# Patient Record
Sex: Male | Born: 1985 | Race: White | Hispanic: No | Marital: Single | Smoking: Never smoker
Health system: Southern US, Community
[De-identification: ages and names within clinical notes are randomized; demographics above are authoritative.]

## PROBLEM LIST (undated history)

## (undated) DIAGNOSIS — F1010 Alcohol abuse, uncomplicated: Secondary | ICD-10-CM

## (undated) DIAGNOSIS — F419 Anxiety disorder, unspecified: Secondary | ICD-10-CM

## (undated) HISTORY — PX: HAND SURGERY: SHX662

---

## 2006-04-23 ENCOUNTER — Emergency Department (HOSPITAL_COMMUNITY): Admission: EM | Admit: 2006-04-23 | Discharge: 2006-04-24 | Payer: Self-pay | Admitting: Emergency Medicine

## 2006-05-14 ENCOUNTER — Emergency Department (HOSPITAL_COMMUNITY): Admission: EM | Admit: 2006-05-14 | Discharge: 2006-05-14 | Payer: Self-pay | Admitting: Emergency Medicine

## 2007-12-21 ENCOUNTER — Ambulatory Visit (HOSPITAL_COMMUNITY): Admission: EM | Admit: 2007-12-21 | Discharge: 2007-12-21 | Payer: Self-pay | Admitting: Emergency Medicine

## 2010-09-21 NOTE — Op Note (Signed)
NAMEMANFORD, SPRONG              ACCOUNT NO.:  [id number]   MEDICAL RECORD NO.:  [id number]          PATIENT TYPE:  INP   LOCATION:  2550                         FACILITY:  [facility]   PHYSICIAN:  Tennis Must Meyerdierks, M.D.DATE OF BIRTH:  06/29/1985   DATE OF PROCEDURE:  12/21/2007  DATE OF DISCHARGE:  12/21/2007                               OPERATIVE REPORT   PREOPERATIVE DIAGNOSIS:  Partial median nerve laceration, left palm.   POSTOPERATIVE DIAGNOSES:  Partial median nerve laceration, left palm  with simple laceration, left third web space.   PROCEDURE:  Repair of partial median nerve laceration, left palm with  repair of laceration, left third web space.   SURGEON:  Lowell Bouton, MD   ANESTHESIA:  General.   OPERATIVE FINDINGS:  The patient had an avulsive-type partial median  nerve laceration.  The ulnar 10% of the nerve had 3 fascicles that were  avulsed from under the epineurial sheath.  The radial portion of the  nerve was intact, and the laceration was proximal to the motor branch  takeoff.  The carpal tunnel and transverse carpal ligament had been  released with the injury.  There were no flexor tendon injuries.   PROCEDURE:  Under general anesthesia with a tourniquet on the left arm,  the left hand was prepped and draped in the usual fashion.  After  elevating the limb, the tourniquet was inflated to 250 mmHg.  The  laceration was about 8 cm in length and it was slightly jagged.  It was  contaminated, and the wound was irrigated copiously and debris was  picked out of the wound edges.  After completely cleansing the wound,  the median nerve was examined under loupe magnification and was found to  have about 3 fascicles avulsed ulnarly.  The remainder of the nerve was  intact.  The injury was in the proximal end of the wound, so the  laceration was extended proximally across the wrist crease.  Blunt  dissection was carried down through the subcutaneous  tissues in the  forearm.  Distal forearm fascia was released with the scissors.  The  microscope was then brought into the field after draping it and was  focused on the area of injury.  The epineurium was released with a  scissors in an attempt to find the distal end of the avulsed fragment of  fascicles.  One fascicle was repaired with a 9-0 nylon suture that had  been transected but was not avulsed.  The avulsed fragments, there were  2 fascicles that were then brought underneath the epineurium and  repaired to the remaining nerve.  This was performed after releasing the  epineurium.  Interfascicular repair was performed with a 9-0 nylon  suture.  The wound was then irrigated copiously with saline.  A vessel  loop drain was left in for drainage.  Marcaine 0.5% was placed in the  skin edges for pain control.  The skin was closed with 4-0 nylon  sutures.  At this point, it was noted that there was a 2-cm laceration  longitudinally in the third web space and  this was irrigated and then closed with 4-0 nylon sutures.  Sterile  dressings were applied followed by a volar wrist splint.  The tourniquet  was released with good circulation of the hand.  The patient went to the  recovery room, awake, stable, and in good condition.      Lowell Bouton, M.D.  Electronically Signed     EMM/MEDQ  D:  12/21/2007  T:  12/22/2007  Job:  [id number]

## 2011-02-04 LAB — CBC
MCHC: 34.2
MCV: 88.3
Platelets: 164
RBC: 5.29
RDW: 13.4
WBC: 16 — ABNORMAL HIGH

## 2011-02-04 LAB — POCT I-STAT, CHEM 8
Calcium, Ion: 1.14
Glucose, Bld: 132 — ABNORMAL HIGH
HCT: 49
TCO2: 25

## 2011-02-04 LAB — DIFFERENTIAL
Eosinophils Absolute: 0
Monocytes Relative: 6

## 2011-06-27 ENCOUNTER — Emergency Department: Payer: Self-pay | Admitting: *Deleted

## 2012-04-05 ENCOUNTER — Encounter (HOSPITAL_COMMUNITY): Payer: Self-pay | Admitting: Emergency Medicine

## 2012-04-05 ENCOUNTER — Emergency Department (HOSPITAL_COMMUNITY): Payer: Self-pay

## 2012-04-05 ENCOUNTER — Emergency Department (HOSPITAL_COMMUNITY)
Admission: EM | Admit: 2012-04-05 | Discharge: 2012-04-05 | Disposition: A | Discharge status: Home / Self Care | Payer: Self-pay | Attending: Emergency Medicine | Admitting: Emergency Medicine

## 2012-04-05 DIAGNOSIS — IMO0002 Reserved for concepts with insufficient information to code with codable children: Secondary | ICD-10-CM | POA: Insufficient documentation

## 2012-04-05 DIAGNOSIS — F419 Anxiety disorder, unspecified: Secondary | ICD-10-CM

## 2012-04-05 DIAGNOSIS — F411 Generalized anxiety disorder: Secondary | ICD-10-CM | POA: Insufficient documentation

## 2012-04-05 DIAGNOSIS — R0602 Shortness of breath: Secondary | ICD-10-CM | POA: Insufficient documentation

## 2012-04-05 HISTORY — DX: Anxiety disorder, unspecified: F41.9

## 2012-04-05 MED ORDER — ALPRAZOLAM 0.5 MG PO TABS
0.5000 mg | ORAL_TABLET | Freq: Once | ORAL | Status: AC
  Administered 2012-04-05: 0.5 mg via ORAL
  Filled 2012-04-05: qty 1

## 2012-04-05 MED ORDER — HYDROXYZINE HCL 25 MG PO TABS: 25.0000 mg | ORAL_TABLET | Freq: Four times a day (QID) | ORAL | Status: DC
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
## 2012-04-05 NOTE — ED Notes (Signed)
Patient transported to X-ray 

## 2012-04-05 NOTE — ED Notes (Signed)
Pt states earlier he had a sensation in his chest he describes as a zap and since then he has been having intermittent pains in his chest  States he became a little winded walking into the hospital but denies any other sxs at this time

## 2012-04-05 NOTE — ED Notes (Signed)
Pt denies radiation of pain. States pain is like an ache to L side of ribs. Pt denies nausea, shortness of air. Skin warm dry.

## 2012-04-08 NOTE — ED Provider Notes (Signed)
Medical screening examination/treatment/procedure(s) were performed by non-physician practitioner and as supervising physician I was immediately available for consultation/collaboration.  Deeandra Jerry T Eilish Mcdaniel, MD 04/08/12 2220 

## 2014-04-17 IMAGING — CR DG CHEST 2V
2 series · 2 of 2 positions shown · non-contrast
Comparison: Chest radiograph performed 12/21/2007

CLINICAL DATA: Left-sided chest pain.

CHEST - 2 VIEW

[w chest pa]
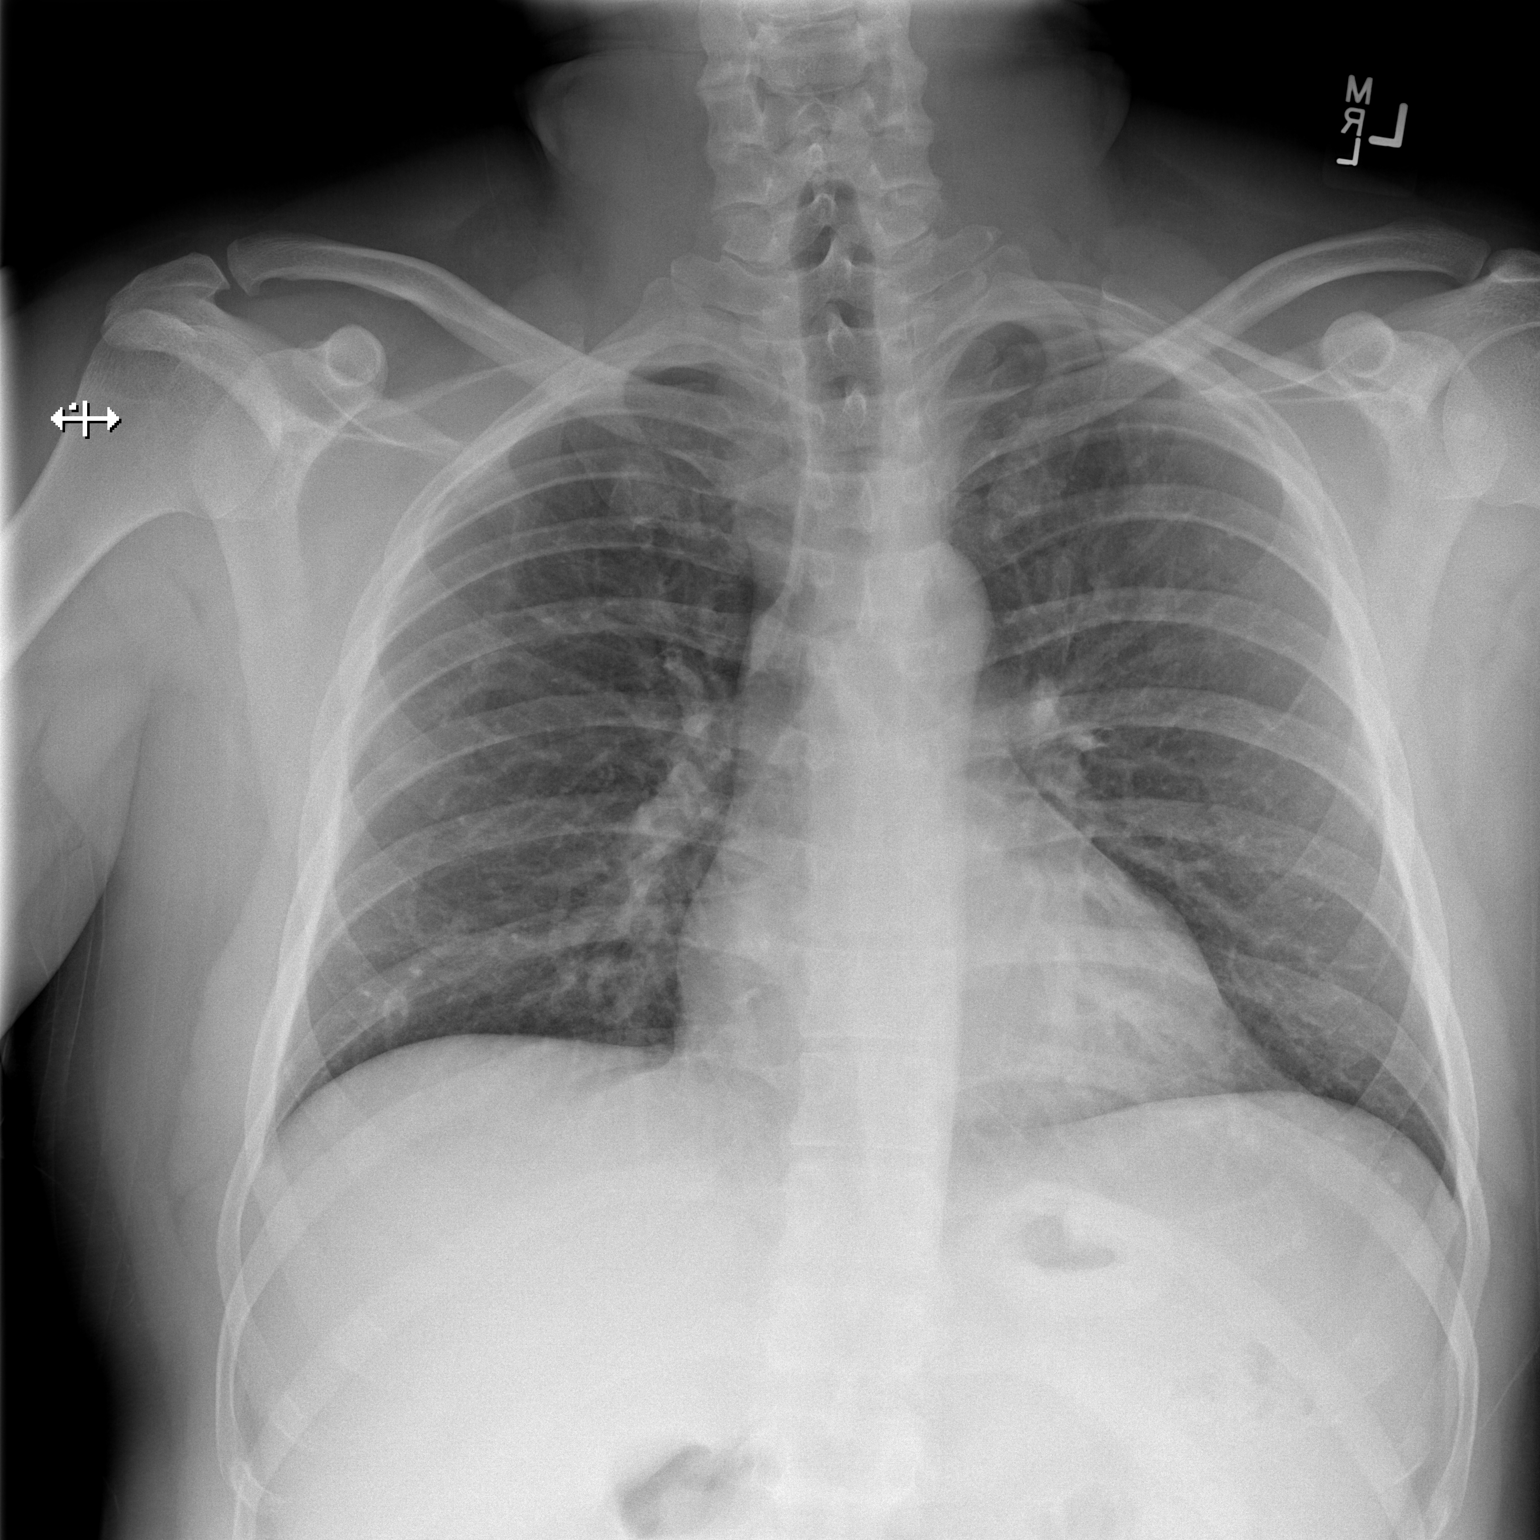

[w chest lat]
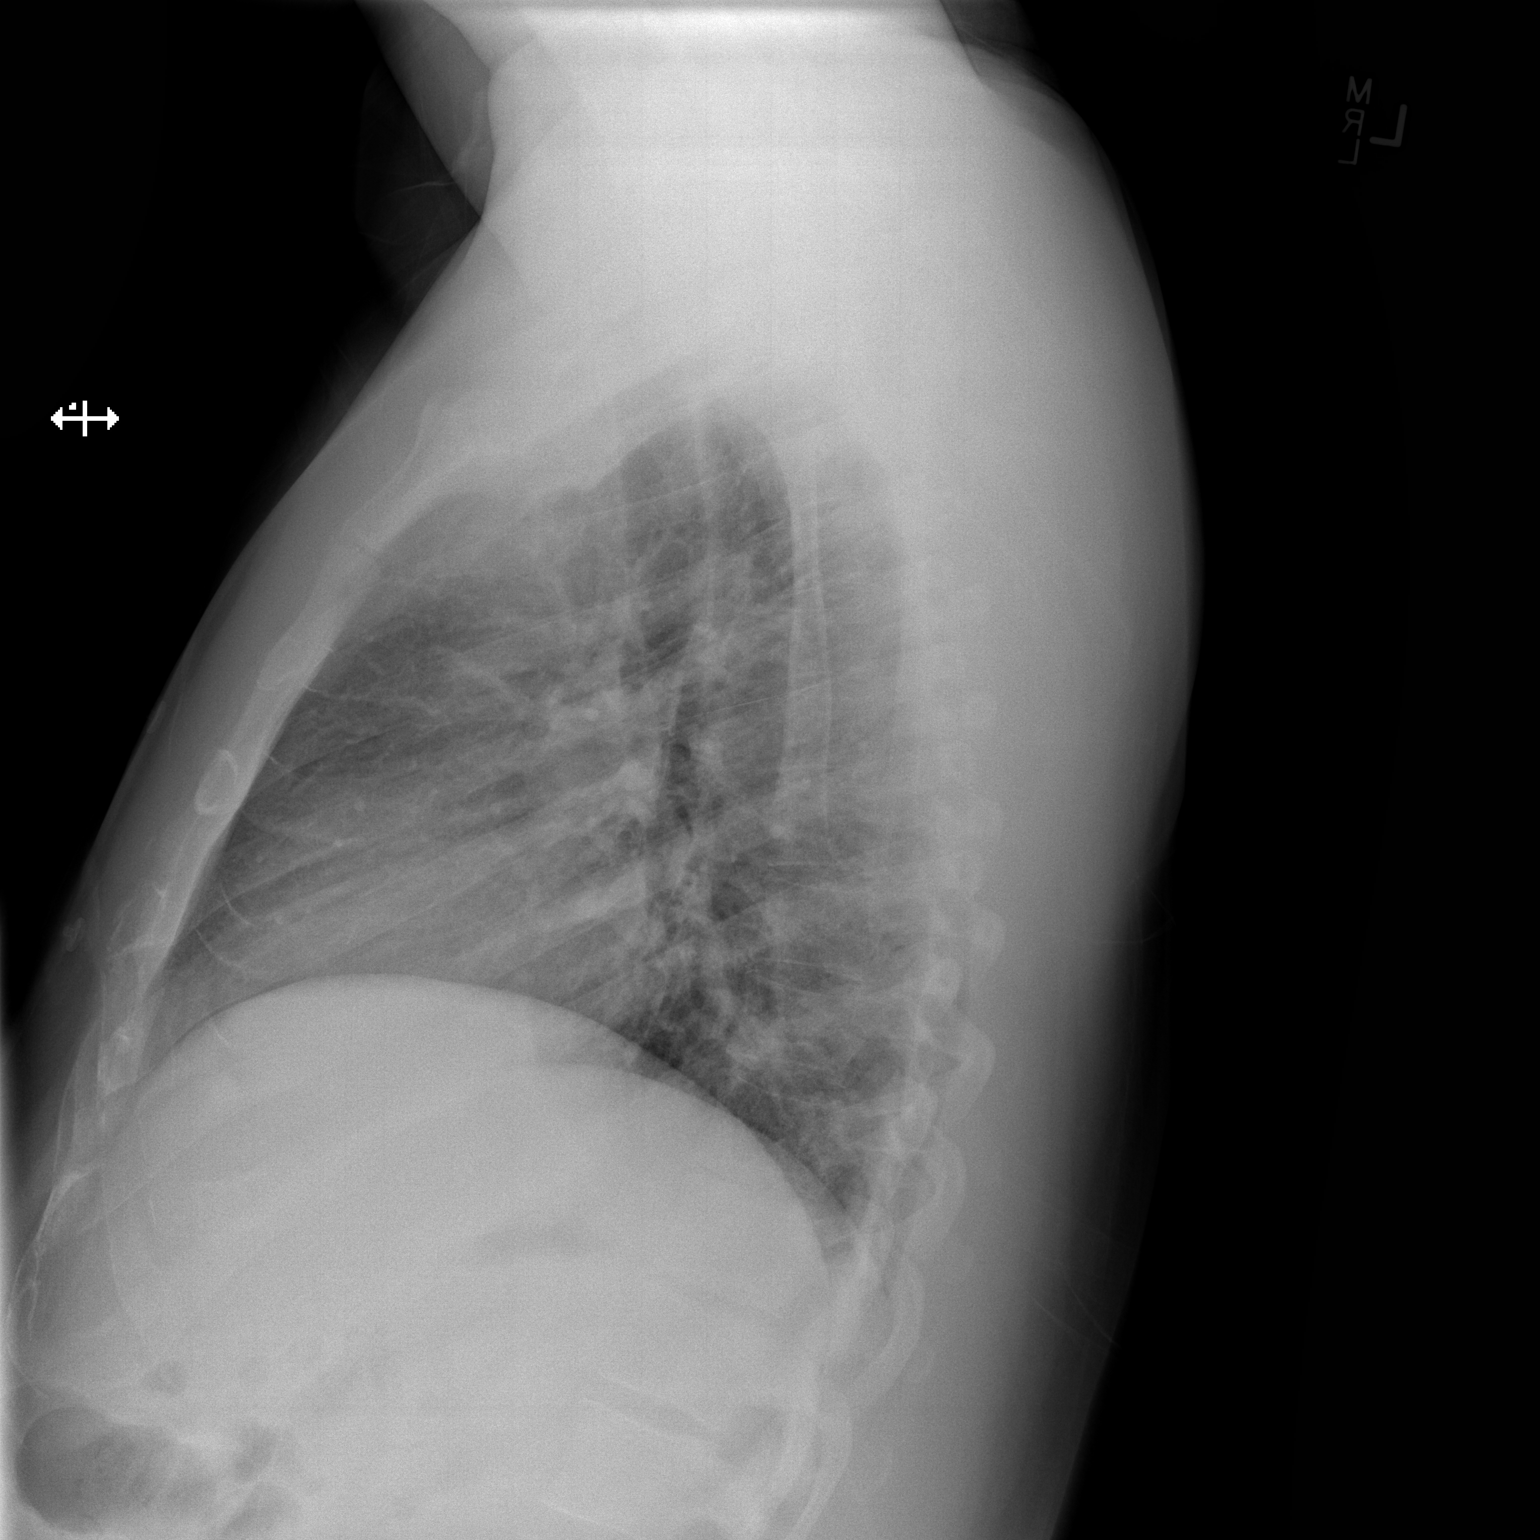

[2 of 2 positions shown; findings below may reference images not displayed]

FINDINGS: The lungs are well-aerated.  Minimal atelectasis or
scarring is noted at the right lung base.  There is no evidence of
focal opacification, pleural effusion or pneumothorax.

The heart is normal in size; the mediastinal contour is within
normal limits.  No acute osseous abnormalities are seen.
IMPRESSION: Minimal atelectasis or scarring noted at the right lung base; lungs
otherwise clear.  No displaced rib fractures seen.

## 2017-10-05 ENCOUNTER — Other Ambulatory Visit: Payer: Self-pay

## 2017-10-05 ENCOUNTER — Ambulatory Visit: Payer: Self-pay | Admitting: Physician Assistant

## 2017-10-05 ENCOUNTER — Encounter: Payer: Self-pay | Admitting: Physician Assistant

## 2017-10-05 VITALS — HR 80 | Resp 16 | Ht 70.0 in | Wt 255.0 lb

## 2017-10-05 DIAGNOSIS — F429 Obsessive-compulsive disorder, unspecified: Secondary | ICD-10-CM

## 2017-10-05 DIAGNOSIS — F411 Generalized anxiety disorder: Secondary | ICD-10-CM

## 2017-10-05 MED ORDER — BUSPIRONE HCL 10 MG PO TABS: 10.0000 mg | ORAL_TABLET | Freq: Three times a day (TID) | ORAL | 0 refills | Status: DC

## 2017-10-05 NOTE — Progress Notes (Signed)
Nathan Chang  MRN: [id number] DOB: Jul 26, 1985  PCP: Nathan Riddle, PA-C  Chief Complaint  Patient presents with  . New pt    health maintenance discussed w/pt.  . medication    per pt therapist would like a medication added to Sertraline 100 mg    Subjective:  Pt presents to clinic for for evaluation and medication discussion regarding his anxiety.  Patient has  had anxiety for as long as he can remember, even into his childhood he was a nervous person.  About 3 years ago he sought treatment for this and was started on Zoloft.  He feels with this treatment he is about 50% better which is a great improvement for him.  He still has significant anxiety which at times is debilitating for him.  He  is able to hide this from people around him and most people think he is fine.  He has been given Ativan for rescue but he finds that this makes him groggy and he does not like the way he feels so he does not use it.  Instead he drinks beer as a rescue medicine sometimes starting around lunchtime working about 8-10 beers a day.  He he never gets drunk.  He does sometimes drink a beer after work just for [employer] and enjoyment drinking beer and not for his anxiety.  Patient feels like he has done something to deserve the symptoms that he is having of his anxiety.  He feels that he is failed because he has the symptoms.  No drugs No smoke    Seeing Victorino Dike [facility] for Cognitive therapy weekly for therapy.  He really does feel like this has helped.  He has made positive changes in his life over the last several months resulting from this therapy.  He started therapy approximately 4 months ago.  History is obtained by patient and wife who accompanies patient today.  Also discussed patient with Daphene Calamity at [facility] for cognitive therapy.  Patient has significant generalized anxiety with some OCD tendencies.  He has had periods of time in his life in the past where his blood pressure was  elevated likely due to whitecoat hypertension/anxiety.  After that patient became obsessed with blood pressure and was checking it many many times a day and therefore is concerned to have medical evaluation of blood pressure.  Review of Systems  Psychiatric/Behavioral: Negative for dysphoric mood, sleep disturbance and suicidal ideas. The patient is nervous/anxious.     There are no active problems to display for this patient.   Current Outpatient Medications on File Prior to Visit  Medication Sig Dispense Refill  . sertraline (ZOLOFT) 100 MG tablet Take 100 mg by mouth daily.     No current facility-administered medications on file prior to visit.     Allergies  Allergen Reactions  . Penicillins     Past Medical History:  Diagnosis Date  . Anxiety    Social History   Social History Narrative   Lives with wife and son   [employer]   Social History   Tobacco Use  . Smoking status: Never Smoker  . Smokeless tobacco: Never Used  Substance Use Topics  . Alcohol use: Yes    Comment: 8-10 beers a day  . Drug use: No   family history includes Anxiety disorder in his mother; Heart attack in his father.     Objective:  Pulse 80 Comment: pt refused  Resp 16   Ht   (1.778 m)   Wt 255 lb (115.7 kg)   BMI 36.59 kg/m  Body mass index is 36.59 kg/m.  Physical Exam  Constitutional: He is oriented to person, place, and time. He appears well-developed and well-nourished.  HENT:  Head: Normocephalic and atraumatic.  Right Ear: External ear normal.  Left Ear: External ear normal.  Eyes: Conjunctivae are normal.  Neck: Normal range of motion.  Pulmonary/Chest: Effort normal.  Neurological: He is alert and oriented to person, place, and time.  Skin: Skin is warm and dry.  Psychiatric: He has a normal mood and affect. His behavior is normal. Judgment and thought content normal.  Tearful at times  Vitals reviewed.   Assessment and Plan :  GAD  (generalized anxiety disorder) - Plan: busPIRone (BUSPAR) 10 MG tablet  Obsessive-compulsive disorder, unspecified type   Patient continues to have significant anxiety even with his 50% improvement and high doses of  Zoloft.  To help the remaining symptoms we will start BuSpar treatment for today.  He will start off with 5 mg twice a day and we will titrate up as he tolerates.  We did discuss in the future perhaps changing to Pristiq.  As well as adding Klonopin for rescue medication versus  beer as the Ativan does not work well for him.  For now I really only want to do one change to his medication regimen.  He is very hesitant to be on medications though does seem excited about perhaps controlling his anxiety better.  He will continue therapy with Victorino Dike at [facility] for cognitive therapy weekly.  He will sign up for my chart to communicate with me regarding his medication and its results.  He will see me in a month.  Patient's blood pressure was not checked today due to having obsessive thoughts regarding elevated blood pressure.  The hope is that we will control his anxiety better so he will feel comfortable with having his blood pressure checked.    Benny Lennert PA-C  Primary [facility] at [facility] 10/05/2017 7:46 PM

## 2017-10-05 NOTE — Patient Instructions (Addendum)
Continue on the Zoloft - take every day  Start Buspar - 1/2 pill 2x/day - in 1 week you will let me Victorino Dike know the results - we will likely increase your dose - you will likely be on it for 3x/day  Please download the APP called mychart - then use the text to activate this APP - this will allow you to look at your labs and contact me as well as make appointments to see me in the future.   IF you received an x-ray today, you will receive an invoice from [facility]. Please contact [facility] at [phone] with questions or concerns regarding your invoice.   IF you received labwork today, you will receive an invoice from [facility]. Please contact [facility] at [phone] with questions or concerns regarding your invoice.   Our billing staff will not be able to assist you with questions regarding bills from these companies.  You will be contacted with the lab results as soon as they are available. The fastest way to get your results is to activate your My Chart account. Instructions are located on the last page of this paperwork. If you have not heard from [place] regarding the results in 2 weeks, please contact this office.

## 2017-10-30 ENCOUNTER — Other Ambulatory Visit: Payer: Self-pay | Admitting: Physician Assistant

## 2017-10-30 ENCOUNTER — Encounter: Payer: Self-pay | Admitting: Physician Assistant

## 2017-10-30 DIAGNOSIS — F411 Generalized anxiety disorder: Secondary | ICD-10-CM

## 2017-10-30 MED ORDER — BUSPIRONE HCL 10 MG PO TABS: 10.0000 mg | ORAL_TABLET | Freq: Three times a day (TID) | ORAL | 0 refills | Status: DC

## 2017-11-27 ENCOUNTER — Encounter: Payer: Self-pay | Admitting: Physician Assistant

## 2017-11-27 ENCOUNTER — Other Ambulatory Visit: Payer: Self-pay | Admitting: Physician Assistant

## 2017-11-27 DIAGNOSIS — F411 Generalized anxiety disorder: Secondary | ICD-10-CM

## 2017-11-30 MED ORDER — BUSPIRONE HCL 10 MG PO TABS: 10.0000 mg | ORAL_TABLET | Freq: Three times a day (TID) | ORAL | 0 refills | Status: DC

## 2017-12-29 ENCOUNTER — Other Ambulatory Visit: Payer: Self-pay | Admitting: Physician Assistant

## 2017-12-29 DIAGNOSIS — F411 Generalized anxiety disorder: Secondary | ICD-10-CM

## 2017-12-29 MED ORDER — BUSPIRONE HCL 10 MG PO TABS: 10.0000 mg | ORAL_TABLET | Freq: Three times a day (TID) | ORAL | 0 refills | Status: DC

## 2017-12-31 ENCOUNTER — Encounter: Payer: Self-pay | Admitting: Physician Assistant

## 2018-01-04 MED ORDER — SERTRALINE HCL 100 MG PO TABS: 200.0000 mg | ORAL_TABLET | Freq: Every day | ORAL | 0 refills | Status: DC

## 2018-02-14 ENCOUNTER — Other Ambulatory Visit: Payer: Self-pay | Admitting: Physician Assistant

## 2023-05-18 ENCOUNTER — Ambulatory Visit (HOSPITAL_COMMUNITY)
Admission: EM | Admit: 2023-05-18 | Discharge: 2023-05-18 | Disposition: A | Discharge status: Home / Self Care | Payer: No Payment, Other | Attending: Psychiatry | Admitting: Psychiatry

## 2023-05-18 DIAGNOSIS — F411 Generalized anxiety disorder: Secondary | ICD-10-CM | POA: Insufficient documentation

## 2023-05-18 DIAGNOSIS — F339 Major depressive disorder, recurrent, unspecified: Secondary | ICD-10-CM | POA: Insufficient documentation

## 2023-05-18 DIAGNOSIS — Z560 Unemployment, unspecified: Secondary | ICD-10-CM | POA: Insufficient documentation

## 2023-05-18 DIAGNOSIS — F410 Panic disorder [episodic paroxysmal anxiety] without agoraphobia: Secondary | ICD-10-CM | POA: Insufficient documentation

## 2023-05-18 DIAGNOSIS — Z79899 Other long term (current) drug therapy: Secondary | ICD-10-CM | POA: Insufficient documentation

## 2023-05-18 MED ORDER — HYDROXYZINE HCL 25 MG PO TABS: 25.0000 mg | ORAL_TABLET | Freq: Three times a day (TID) | ORAL | 0 refills | Status: DC | PRN

## 2023-05-18 MED ORDER — HYDROXYZINE HCL 25 MG PO TABS: 25.0000 mg | ORAL_TABLET | Freq: Three times a day (TID) | ORAL | Status: DC | PRN

## 2023-05-18 MED ORDER — HYDROXYZINE HCL 25 MG PO TABS
50.0000 mg | ORAL_TABLET | ORAL | Status: AC
  Administered 2023-05-18: 50 mg via ORAL
  Filled 2023-05-18: qty 2

## 2023-05-18 NOTE — Progress Notes (Signed)
   05/18/23 1923  [facility] Triage Screening (Walk-ins at [facility] only)  How Did You Hear About Us ? Self  What Is the Reason for Your Visit/Call Today? Pt is a 38 yo male who presents to [facility]  accompanied by his wife.Pt is seeking treatment for his alcohol use and panic attacks.  Pt reports that for the past week he has been heavily drinking beer and has panic attacks that is causing him not sleep or eat. Pt reports that he is drinking 12-15 beers daily. Pt reports the last use today. Pt reports hx of Panic Disorder. Pt reports that he stopped drinking in Oct 2024 for 2 months but began heavily drinking this past week.  Pt denies having outpatient services. Pt is prescribed Lexapro from his primary doctor. Pt reports havinh passive SI thought but denies plan and intent. Pt denies HI, and AVH.  How Long Has This Been Causing You Problems? 1 wk - 1 month  Have You Recently Had Any Thoughts About Hurting Yourself? Yes  How long ago did you have thoughts about hurting yourself? This past week  Are You Planning to Commit Suicide/Harm Yourself At This time? No  Have you Recently Had Thoughts About Hurting Someone Sherral? No  Are You Planning To Harm Someone At This Time? No  Physical Abuse Denies  Verbal Abuse Denies  Sexual Abuse Denies  Exploitation of patient/patient's resources Denies  Self-Neglect Denies  Possible abuse reported to:  (n/a)  Are you currently experiencing any auditory, visual or other hallucinations? No  Have You Used Any Alcohol or Drugs in the Past 24 Hours? Yes  How long ago did you use Drugs or Alcohol? Earlier today  What Did You Use and How Much? Alcohol ( Beers) 12-15 beers  Do you have any current medical co-morbidities that require immediate attention? No  Clinician description of patient physical appearance/behavior: Pt was anxious and tearful but cooperative.  What Do You Feel Would Help You the Most Today? Alcohol or Drug Use Treatment;Treatment for Depression or other mood  problem;Medication(s);Stress Management  If access to [facility] was not available, would you have sought care in the Emergency Department? Yes  Determination of Need Urgent (48 hours)  Options For Referral Facility-Based Crisis  Determination of Need filed? Yes    Flowsheet Row ED from 05/18/2023 in [facility]  C-SSRS RISK CATEGORY Low Risk

## 2023-05-18 NOTE — ED Provider Notes (Signed)
 [facility] Medical Screening Exam  Patient Name: Nathan Chang. MRN: [id number] Date of Evaluation: 05/18/23 Chief Complaint:  been having increase anxiety and panic attach  Diagnosis:  Final diagnoses:  Generalized anxiety disorder  Panic attack  Recurrent major depressive disorder, remission status unspecified (HCC)    History of Present illness: Nathan Chang. is a 38 y.o. male. With a history of MDD and anxiety presented to [facility] accompanied by his wife.  Per the patient he has been having panic attacks where he would wake up at night feeling cold heart racing feel like a flight or fight mode, not wanting to eat or sleep.  Per the patient this has happened to him before but it only lasted that 2 days but this last episode has been since last week.  Patient also reports feeling fearful of being alone.  According to patient he is currently not seeing a psychiatrist or therapist but is prescribed Lexapro 10 mg by his PCP.  Patient reported that he has taken lorazepam  at in the past which helps a lot.  Patient is currently unemployed lives with his wife and his 55-year-old son.  Patient denies any prior hospitalization for any psychiatric problems.  Face-to-face evaluation of patient, patient is alert and oriented x 4, speech is clear, maintain eye contact.  Patient is observed sitting beside his wife overall patient appearance is pleasant and well groomed.  Patient denies HI, SI, AVH or paranoia.  Patient denies wanting to hurt himself or others.  Patient reports that he drinks alcohol on a daily basis, reports that alcohol giving him relief from anxiety.  When asked if he is looking detox patient stated no he just needed to talk to someone and get some relief for his anxiety.  According to the patient he spoke with his PCP today but they did not have any available appointments so he is just waiting to hear back from them to go in for an appointment.  Patient reports  he has a gun at home but does not have suicidal ideation or does not ever want to hurt anyone. Patient does not seem to be influenced by external/internal stimuli.  Patient denies smoking and denies any illicit drug use at this time. Writer discussed with patient that we could give him some hydroxyzine  25 to 50 mg 3 times a day prescription until he can get to going to see his PCP given the inclement weather patient may not be able to get in to see his PCP until next Monday.   Patient is advised to call 911 or go to the nearest ED should he experience any suicidal ideation/homicidal thoughts/hallucination.  Patient verbalized understanding. At the end of this evaluation patient does not seem to pose a risk to himself or others is not suicidal or homicidal not hearing voices is not influenced by external or internal stimuli and can safely be discharged home to follow up with his PCP. Patient was given a prescription for hydroxyzine  as needed can take up to 3 times a day for the next 5 days and need to follow up with PCP for continuation.  Patient is also advised that he can increase his Lexapro to 20 mg until he goes in to see his PCP.  Meds ordered this encounter  Medications   DISCONTD: hydrOXYzine  (ATARAX ) tablet 25 mg   hydrOXYzine  (ATARAX ) tablet 50 mg   hydrOXYzine  (ATARAX ) 25 MG tablet    Sig: Take 1 tablet (25 mg total)  by mouth 3 (three) times daily as needed for anxiety.    Dispense:  15 tablet    Refill:  0    Supervising Provider:   ZOUEV, DMITRI [[id number]]     Recommend discharge for patient to f/u with PCP   Flowsheet Row ED from 05/18/2023 in [facility]  C-SSRS RISK CATEGORY Low Risk       Psychiatric Specialty Exam  Presentation  General Appearance:Casual  Eye Contact:Good  Speech:Clear and Coherent  Speech Volume:Normal  Handedness:Right   Mood and Affect  Mood: Anxious; Depressed  Affect: Appropriate   Thought Process   Thought Processes: Coherent  Descriptions of Associations:Circumstantial  Orientation:Full (Time, Place and Person)  Thought Content:Logical    Hallucinations:None  Ideas of Reference:None  Suicidal Thoughts:No  Homicidal Thoughts:No   Sensorium  Memory: Immediate Good  Judgment: Good  Insight: Good   Executive Functions  Concentration: Good  Attention Span: Good  Recall: Good  Fund of Knowledge: Good  Language: Good   Psychomotor Activity  Psychomotor Activity: Normal   Assets  Assets: Communication Skills   Sleep  Sleep: Poor  Number of hours:  3   Physical Exam: Physical Exam HENT:     Head: Normocephalic.     Nose: Nose normal.  Eyes:     Pupils: Pupils are equal, round, and reactive to light.  Cardiovascular:     Rate and Rhythm: Normal rate.  Pulmonary:     Effort: Pulmonary effort is normal.  Musculoskeletal:        General: Normal range of motion.     Cervical back: Normal range of motion.  Neurological:     General: No focal deficit present.     Mental Status: He is alert.  Psychiatric:        Mood and Affect: Mood normal.    Review of Systems  Constitutional: Negative.   HENT: Negative.    Eyes: Negative.   Respiratory: Negative.    Cardiovascular: Negative.   Gastrointestinal: Negative.   Genitourinary: Negative.   Musculoskeletal: Negative.   Skin: Negative.   Neurological: Negative.   Psychiatric/Behavioral:  Positive for depression. The patient is nervous/anxious and has insomnia.    Blood pressure (!) 157/103, pulse 100, temperature 98.4 F (36.9 C), temperature source Oral, resp. rate 18, SpO2 99%. There is no height or weight on file to calculate BMI.  Musculoskeletal: Strength & Muscle Tone: within normal limits Gait & Station: normal Patient leans: N/A   [facility] MSE Discharge Disposition for Follow up and Recommendations: Based on my evaluation the patient does not appear to have an emergency  medical condition and can be discharged with resources and follow up care in outpatient services for Medication Management and Individual Therapy   Gaither Pouch, NP 05/18/2023, 8:24 PM

## 2023-05-18 NOTE — Discharge Instructions (Signed)
 F/u with PCP.

## 2023-10-21 ENCOUNTER — Other Ambulatory Visit: Payer: Self-pay

## 2023-10-21 ENCOUNTER — Emergency Department (HOSPITAL_COMMUNITY): Payer: Self-pay

## 2023-10-21 ENCOUNTER — Encounter (HOSPITAL_COMMUNITY): Payer: Self-pay

## 2023-10-21 ENCOUNTER — Emergency Department (HOSPITAL_COMMUNITY)
Admission: EM | Admit: 2023-10-21 | Discharge: 2023-10-23 | Disposition: A | Discharge status: Other Acute Inpatient Hospital | Attending: Emergency Medicine | Admitting: Emergency Medicine

## 2023-10-21 ENCOUNTER — Ambulatory Visit (HOSPITAL_COMMUNITY)
Admission: EM | Admit: 2023-10-21 | Discharge: 2023-10-21 | Disposition: A | Discharge status: Other Acute Inpatient Hospital | Attending: Nurse Practitioner | Admitting: Nurse Practitioner

## 2023-10-21 DIAGNOSIS — F1010 Alcohol abuse, uncomplicated: Secondary | ICD-10-CM

## 2023-10-21 DIAGNOSIS — R059 Cough, unspecified: Secondary | ICD-10-CM | POA: Insufficient documentation

## 2023-10-21 DIAGNOSIS — R7401 Elevation of levels of liver transaminase levels: Secondary | ICD-10-CM | POA: Insufficient documentation

## 2023-10-21 DIAGNOSIS — F1090 Alcohol use, unspecified, uncomplicated: Secondary | ICD-10-CM

## 2023-10-21 DIAGNOSIS — R55 Syncope and collapse: Secondary | ICD-10-CM | POA: Insufficient documentation

## 2023-10-21 DIAGNOSIS — R079 Chest pain, unspecified: Secondary | ICD-10-CM | POA: Insufficient documentation

## 2023-10-21 DIAGNOSIS — F32A Depression, unspecified: Secondary | ICD-10-CM | POA: Insufficient documentation

## 2023-10-21 DIAGNOSIS — F10939 Alcohol use, unspecified with withdrawal, unspecified: Secondary | ICD-10-CM | POA: Diagnosis present

## 2023-10-21 DIAGNOSIS — R0602 Shortness of breath: Secondary | ICD-10-CM | POA: Insufficient documentation

## 2023-10-21 DIAGNOSIS — F411 Generalized anxiety disorder: Secondary | ICD-10-CM | POA: Diagnosis present

## 2023-10-21 DIAGNOSIS — R61 Generalized hyperhidrosis: Secondary | ICD-10-CM | POA: Insufficient documentation

## 2023-10-21 DIAGNOSIS — I10 Essential (primary) hypertension: Secondary | ICD-10-CM | POA: Insufficient documentation

## 2023-10-21 DIAGNOSIS — F10239 Alcohol dependence with withdrawal, unspecified: Secondary | ICD-10-CM | POA: Insufficient documentation

## 2023-10-21 DIAGNOSIS — Y907 Blood alcohol level of 200-239 mg/100 ml: Secondary | ICD-10-CM | POA: Insufficient documentation

## 2023-10-21 HISTORY — DX: Alcohol abuse, uncomplicated: F10.10

## 2023-10-21 LAB — COMPREHENSIVE METABOLIC PANEL WITH GFR
ALT: 332 U/L — ABNORMAL HIGH (ref 0–44)
AST: 361 U/L — ABNORMAL HIGH (ref 15–41)
Albumin: 2.7 g/dL — ABNORMAL LOW (ref 3.5–5.0)
Alkaline Phosphatase: 69 U/L (ref 38–126)
Anion gap: 14 (ref 5–15)
BUN: 15 mg/dL (ref 6–20)
CO2: 22 mmol/L (ref 22–32)
Calcium: 7.6 mg/dL — ABNORMAL LOW (ref 8.9–10.3)
Chloride: 98 mmol/L (ref 98–111)
Creatinine, Ser: 1.24 mg/dL (ref 0.61–1.24)
GFR, Estimated: >60 mL/min (ref >60)
Glucose, Bld: 148 mg/dL — ABNORMAL HIGH (ref 70–99)
Potassium: 3.9 mmol/L (ref 3.5–5.1)
Sodium: 134 mmol/L — ABNORMAL LOW (ref 135–145)
Total Bilirubin: 1.2 mg/dL (ref 0.0–1.2)
Total Protein: 5.6 g/dL — ABNORMAL LOW (ref 6.5–8.1)

## 2023-10-21 LAB — PROTIME-INR
INR: 1.2 (ref 0.8–1.2)
Prothrombin Time: 15 s (ref 11.4–15.2)

## 2023-10-21 LAB — ACETAMINOPHEN LEVEL: Acetaminophen (Tylenol), Serum: <10 ug/mL — ABNORMAL LOW (ref 10–30)

## 2023-10-21 LAB — CBC WITH DIFFERENTIAL/PLATELET
Abs Immature Granulocytes: 0.02 10*3/uL (ref 0.00–0.07)
Basophils Absolute: 0.1 10*3/uL (ref 0.0–0.1)
Basophils Relative: 2 %
Eosinophils Absolute: 0 10*3/uL (ref 0.0–0.5)
Eosinophils Relative: 1 %
HCT: 55 % — ABNORMAL HIGH (ref 39.0–52.0)
Hemoglobin: 19.7 g/dL — ABNORMAL HIGH (ref 13.0–17.0)
Immature Granulocytes: 0 %
Lymphocytes Relative: 37 %
Lymphs Abs: 2 10*3/uL (ref 0.7–4.0)
MCH: 30.9 pg (ref 26.0–34.0)
MCHC: 35.8 g/dL (ref 30.0–36.0)
MCV: 86.2 fL (ref 80.0–100.0)
Monocytes Absolute: 0.5 10*3/uL (ref 0.1–1.0)
Monocytes Relative: 9 %
Neutro Abs: 2.9 10*3/uL (ref 1.7–7.7)
Neutrophils Relative %: 51 %
Platelets: 248 10*3/uL (ref 150–400)
RBC: 6.38 MIL/uL — ABNORMAL HIGH (ref 4.22–5.81)
RDW: 12.5 % (ref 11.5–15.5)
WBC: 5.5 10*3/uL (ref 4.0–10.5)
nRBC: 0 % (ref 0.0–0.2)

## 2023-10-21 LAB — TYPE AND SCREEN
ABO/RH(D): A POS
Antibody Screen: NEGATIVE

## 2023-10-21 LAB — SALICYLATE LEVEL: Salicylate Lvl: <7 mg/dL — ABNORMAL LOW (ref 7.0–30.0)

## 2023-10-21 LAB — LIPASE, BLOOD: Lipase: 25 U/L (ref 11–51)

## 2023-10-21 LAB — ETHANOL: Alcohol, Ethyl (B): 235 mg/dL — ABNORMAL HIGH (ref <15)

## 2023-10-21 MED ORDER — LACTATED RINGERS IV BOLUS: 1000.0000 mL | Freq: Once | INTRAVENOUS | Status: AC

## 2023-10-21 MED ORDER — LORAZEPAM 2 MG/ML IJ SOLN
1.0000 mg | Freq: Once | INTRAMUSCULAR | Status: AC
  Filled 2023-10-21: qty 1

## 2023-10-21 MED ORDER — LORAZEPAM 2 MG/ML IJ SOLN
0.0000 mg | Freq: Four times a day (QID) | INTRAMUSCULAR | Status: AC
  Administered 2023-10-22: 1 mg via INTRAVENOUS
  Administered 2023-10-22: 2 mg via INTRAVENOUS
  Administered 2023-10-22: 1 mg via INTRAVENOUS
  Filled 2023-10-21 (×5): qty 1

## 2023-10-21 MED ORDER — ONDANSETRON HCL 4 MG/2ML IJ SOLN
4.0000 mg | Freq: Once | INTRAMUSCULAR | Status: AC
  Administered 2023-10-21: 4 mg via INTRAVENOUS
  Filled 2023-10-21: qty 2

## 2023-10-21 MED ORDER — LORAZEPAM 1 MG PO TABS
0.0000 mg | ORAL_TABLET | Freq: Four times a day (QID) | ORAL | Status: AC
  Administered 2023-10-22 – 2023-10-23 (×3): 1 mg via ORAL
  Filled 2023-10-21 (×3): qty 1

## 2023-10-21 MED ORDER — THIAMINE MONONITRATE 100 MG PO TABS
100.0000 mg | ORAL_TABLET | Freq: Every day | ORAL | Status: DC
  Administered 2023-10-22 – 2023-10-23 (×2): 100 mg via ORAL
  Filled 2023-10-21 (×2): qty 1

## 2023-10-21 MED ORDER — ONDANSETRON HCL 4 MG/2ML IJ SOLN
4.0000 mg | Freq: Once | INTRAMUSCULAR | Status: AC
  Filled 2023-10-21: qty 2

## 2023-10-21 MED ORDER — THIAMINE HCL 100 MG/ML IJ SOLN
100.0000 mg | Freq: Every day | INTRAMUSCULAR | Status: DC
  Filled 2023-10-21: qty 2

## 2023-10-21 NOTE — ED Provider Notes (Signed)
 [facility]gent [facility] Medical Screening Exam  Patient Name: Nathan Chang. MRN: [id number] Date of Evaluation: 10/21/23 Chief Complaint:  worsening depression and alcohol ujse Diagnosis:  Final diagnoses:  Alcohol use disorder   History of Present illness: Nathan Chang. is a 38 y.o. male with a reported history of alcohol use d/o, MDD, GAD, & htn who [facility] accompanied by his wife, brother, and father with complaints of worsening depressive symptoms as well as alcohol use x 2 weeks now.  Assessment: At start of assessment, patient makes attempts to provide some history, but most of the history is obtained from his wife Loris Ros), with patient's verbal consent.  They report that patient has been drinking exclusively alcohol, for the past 4 weeks at least, worsening in the past 2 weeks, and drinking a case of beer +1/5 of liquor daily in that timeframe.  Wife reports that patient has not gotten out of bed in that timeframe as well, both report that patient has presented with some suicidal ideation in that timeframe, however, patient currently denies feeling suicidal, denies homicidal ideations, denies psychosis; specifically denies auditory, visual, or tactile hallucinations.  He denies paranoia, denies delusional thinking, denies first rank symptoms.  There are no overt signs of psychosis.  Patient's wife however reports that patient has required someone to sit with him in that timeframe, has not wanted to sit by himself.  She reports that he has been sleeping for 23 hours/day, has been verbalizing things that do not make sense.  Prior to assessment proceeding any further, rechecked vitals, and blood pressure was 84/66, with a heart rate of 73, and O2 saturation on room air dropped from 99% on room air to 79, and patient briefly lost consciousness, prior to regaining it, and being encouraged to engage in deep breathing exercises.   The nonrebreather mask was applied on patient at 4 L of oxygen of oxygen, and oxygen saturation went back up to 99 to 100%.  Prior to the nonrebreather being applied, patient had some coffee grounds sputum that were concerning for esophageal varices given his prolonged alcohol abuse in the absence of food.  He was also noted to be diaphoretic, sweating profusely, and continued to be in and out of consciousness, requiring for EMS to be called.  Report was called to Dr. Tamela Fake at the [facility] ED for patient to be transported there, for possible medical evaluation and treatment. EMS was able to transport patient to the [facility] ED uneventfully.  Patient's family was notified of the series of events leading to the transfer, and all verbalized understanding. V/S were WNL prior to transfer with a slight elevation in HR at 104.  Flowsheet Row ED from 05/18/2023 in [facility]  C-SSRS RISK CATEGORY Low Risk    Psychiatric Specialty Exam  Presentation  General Appearance:Disheveled  Eye Contact:Fair  Speech:Clear and Coherent  Speech Volume:Decreased  Handedness:Right   Mood and Affect  Mood: Depressed  Affect: Congruent   Thought Process  Thought Processes: Coherent  Descriptions of Associations:Intact  Orientation:Partial  Thought Content:Logical    Hallucinations:None  Ideas of Reference:None  Suicidal Thoughts:No  Homicidal Thoughts:No   Sensorium  Memory: Recent Fair  Judgment: Poor  Insight: Fair   Art therapist  Concentration: Fair  Attention Span: Fair  Recall: [employer] of Knowledge: Fair  Language: Fair   Psychomotor Activity  Psychomotor Activity: Decreased   Assets  Assets: Resilience  Sleep  Sleep: Poor  Number of hours:  3   Physical Exam: Physical Exam Vitals and nursing note reviewed.    Review of Systems  Psychiatric/Behavioral:  Positive for depression and substance  abuse. Negative for hallucinations, memory loss and suicidal ideas. The patient is nervous/anxious and has insomnia.   All other systems reviewed and are negative.  Blood pressure 109/79, pulse (!) 104, temperature 98.2 F (36.8 C), resp. rate 20, SpO2 100%. There is no height or weight on file to calculate BMI.  Musculoskeletal: Strength & Muscle Tone: flaccid Gait & Station: unsteady Patient leans: N/A   [facility] MSE Discharge Disposition for Follow up and Recommendations: Based on my evaluation the patient appears to have an emergency medical condition for which I recommend the patient be transferred to the emergency department for further evaluation.   Robet Chiquito, NP 10/21/2023, 11:49 AM

## 2023-10-21 NOTE — ED Triage Notes (Signed)
 Pt BIB [facility] from [facility] for alcohol withdrawal. Pt last had a drink this morning, is now c/o nausea, anxiety, and generalized weakness. Per EMS BP at [facility] in 80s sys, BP with EMS 100/70, HR 88, all other VSS.

## 2023-10-21 NOTE — ED Provider Notes (Signed)
 [place] EMERGENCY DEPARTMENT AT [facility] Provider Note   CSN: [id number] Arrival date & time: 10/21/23  1200     Patient presents with: Alcohol Problem   Nathan Chang. is a 38 y.o. male.   HPI     38 year old male with a history of alcohol use and dependence, major depressive disorder, generalized anxiety disorder, hypertension who was sent from the behavioral health center with concern for worsening depression, alcohol use and desire for rehab.  Wife and patient report over the last 2 weeks he has been drinking excessively.  He has a history of alcohol abuse in the past, but over the last 2 weeks he has really just been drinking alcohol, and not eating or drinking much of anything else.  He has not been getting out of bed.  He just reports feeling really sick overall.  He also was drinking alcohol this morning.  He has had a history of some withdrawal in the past, but does not have symptoms of withdrawal yet, with drinking this morning.  He has not any fevers.  He a cough, chest pain, shortness of breath.  He has nausea, generalized feeling of being unwell, and feels like he is dehydrated.  He reports he has had some suicidal ideation, but at this moment does not have SI with the plan.  He has had suicidal thoughts over the last couple of weeks and noticed increasing depression.  He reports he is ready for help with his alcohol use.  Prior to Admission medications   Medication Sig Start Date End Date Taking? Authorizing Provider  busPIRone  (BUSPAR ) 10 MG tablet Take 1 tablet (10 mg total) by mouth 3 (three) times daily. 12/29/17   Weber, Clance Crigler, PA-C  hydrOXYzine  (ATARAX ) 25 MG tablet Take 1 tablet (25 mg total) by mouth 3 (three) times daily as needed for anxiety. 05/18/23   Dorthea Gauze, NP  sertraline  (ZOLOFT ) 100 MG tablet Take 2 tablets (200 mg total) by mouth daily. 01/04/18   Emilie Harden, Clance Crigler, PA-C    Allergies: Penicillins    Review of Systems  Updated  Vital Signs BP (!) 124/95 (BP Location: Right Arm)   Pulse 78   Temp 97.8 F (36.6 C) (Oral)   Resp 18   Ht 5' 10 (1.778 m)   Wt 127 kg   SpO2 99%   BMI 40.18 kg/m   Physical Exam  (all labs ordered are listed, but only abnormal results are displayed) Labs Reviewed  CBC WITH DIFFERENTIAL/PLATELET - Abnormal; Notable for the following components:      Result Value   RBC 6.38 (*)    Hemoglobin 19.7 (*)    HCT 55.0 (*)    All other components within normal limits  COMPREHENSIVE METABOLIC PANEL WITH GFR - Abnormal; Notable for the following components:   Sodium 134 (*)    Glucose, Bld 148 (*)    Calcium 7.6 (*)    Total Protein 5.6 (*)    Albumin 2.7 (*)    AST 361 (*)    ALT 332 (*)    All other components within normal limits  ETHANOL - Abnormal; Notable for the following components:   Alcohol, Ethyl (B) 235 (*)    All other components within normal limits  ACETAMINOPHEN LEVEL - Abnormal; Notable for the following components:   Acetaminophen (Tylenol), Serum <10 (*)    All other components within normal limits  SALICYLATE LEVEL - Abnormal; Notable for the following components:  Salicylate Lvl <7.0 (*)    All other components within normal limits  PROTIME-INR  LIPASE, BLOOD  RAPID URINE DRUG SCREEN, HOSP PERFORMED  TYPE AND SCREEN    EKG: EKG Interpretation Date/Time:  Saturday October 21 2023 13:14:25 EDT Ventricular Rate:  71 PR Interval:  166 QRS Duration:  110 QT Interval:  390 QTC Calculation: 424 R Axis:   100  Text Interpretation: Sinus rhythm Confirmed by Lowery Rue [phone]) on 10/21/2023 3:21:38 PM  Radiology: Lenell Query Chest Portable 1 View Result Date: 10/21/2023 CLINICAL DATA:  Hypoxia. EXAM: PORTABLE CHEST 1 VIEW COMPARISON:  04/05/2012. FINDINGS: The heart is mildly enlarged the mediastinal contours within normal limits. Lung volumes are low. No consolidation, effusion, or pneumothorax is seen. No acute osseous abnormality. IMPRESSION: Low lung volumes  with no active disease. Electronically Signed   By: Wyvonnia Heimlich M.D.   On: 10/21/2023 13:15     Procedures   Medications Ordered in the ED  LORazepam (ATIVAN) injection 0-4 mg (1 mg Intravenous Given 10/21/23 2224)    Or  LORazepam (ATIVAN) tablet 0-4 mg ( Oral See Alternative 10/21/23 2224)  thiamine (VITAMIN B1) tablet 100 mg ( Oral See Alternative 10/21/23 1654)    Or  thiamine (VITAMIN B1) injection 100 mg (100 mg Intravenous Given 10/21/23 1654)  lactated ringers bolus 1,000 mL (0 mLs Intravenous Stopped 10/21/23 1512)  ondansetron (ZOFRAN) injection 4 mg (4 mg Intravenous Given 10/21/23 1311)  ondansetron (ZOFRAN) injection 4 mg (4 mg Intravenous Given 10/21/23 1518)  lactated ringers bolus 1,000 mL (0 mLs Intravenous Stopped 10/21/23 1646)  lactated ringers bolus 1,000 mL (0 mLs Intravenous Stopped 10/21/23 1812)  LORazepam (ATIVAN) injection 1 mg (1 mg Intravenous Given 10/21/23 1900)                                      38 year old male with a history of alcohol use and dependence, major depressive disorder, generalized anxiety disorder, hypertension who was sent from the behavioral health center with concern for worsening depression, alcohol use and desire for rehab.  While he was at behavioral health, they reported that his blood pressure was 84/66, oxygen level was down to 79%.  He was placed on nonrebreather.  On arrival to the emergency department, he is having normal oxygen saturation on room air.  His blood pressures had improved to 90s over 60s to 100s.  Labs completed and personally evaluated interpreted by me show mildly elevated hemoglobin likely in the setting of hemoconcentration from dehydration, CMP with hemolysis, however with noted transaminitis.  Alcohol level 235.  Negative acetaminophen and salicylate levels.  Lipase within normal limits.  Not having abdominal pain or tenderness, and have low suspicion that his transaminitis represents cholecystitis, or other acute  intra-abdominal process.  His bilirubin is normal.  Suspect his transaminitis is in the setting of alcohol use.  Chest x-ray obtained shows low lung volumes without active disease.  He still feels nauseas and lightheaded at this time.  Clinically suspect dehydration in the setting of alcohol use.  Not having symptoms of withdrawal yet as he has presented intoxicated but discussed we understand the risk of developing this.   Plan to give him additional fluids, po challenge.  Anticipate medical clearance after fluid.    Plan to consult TTS for further evaluation in setting of depression, alcohol use. He is here voluntarily.       Final diagnoses:  Alcohol abuse  Depression, unspecified depression type    ED Discharge Orders     None          Scarlette Currier, MD 10/21/23 [phone]

## 2023-10-22 ENCOUNTER — Encounter (HOSPITAL_COMMUNITY): Payer: Self-pay

## 2023-10-22 DIAGNOSIS — F411 Generalized anxiety disorder: Secondary | ICD-10-CM | POA: Diagnosis present

## 2023-10-22 DIAGNOSIS — F10939 Alcohol use, unspecified with withdrawal, unspecified: Secondary | ICD-10-CM | POA: Diagnosis present

## 2023-10-22 DIAGNOSIS — F10930 Alcohol use, unspecified with withdrawal, uncomplicated: Secondary | ICD-10-CM

## 2023-10-22 LAB — RAPID URINE DRUG SCREEN, HOSP PERFORMED
Amphetamines: NOT DETECTED
Barbiturates: NOT DETECTED
Benzodiazepines: POSITIVE — AB
Cocaine: NOT DETECTED
Opiates: NOT DETECTED
Tetrahydrocannabinol: NOT DETECTED

## 2023-10-22 MED ORDER — ONDANSETRON 4 MG PO TBDP
4.0000 mg | ORAL_TABLET | Freq: Three times a day (TID) | ORAL | Status: DC | PRN
  Administered 2023-10-22: 4 mg via ORAL
  Filled 2023-10-22: qty 1

## 2023-10-22 NOTE — ED Notes (Signed)
 Pt ambulated to bathroom and back. This RN provided patient with sprite to drink.
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
## 2023-10-22 NOTE — ED Provider Notes (Signed)
 Emergency Medicine Observation Re-evaluation Note  Nathan Mccort. is a 38 y.o. male, seen on rounds today.  Pt initially presented to the ED for complaints of Alcohol Problem Currently, the patient is awaiting psychiatry admit.  Physical Exam  BP (!) 152/89   Pulse (!) 105   Temp 97.8 F (36.6 C) (Oral)   Resp 18   Ht 5' 10 (1.778 m)   Wt 127 kg   SpO2 97%   BMI 40.18 kg/m  Physical Exam General: Awake. Non-tremulous. No confusion.  Cardiac: Well perfused. Lungs: Even respirations Psych: Calm  ED Course / MDM  EKG:EKG Interpretation Date/Time:  Saturday October 21 2023 13:14:25 EDT Ventricular Rate:  71 PR Interval:  166 QRS Duration:  110 QT Interval:  390 QTC Calculation: 424 R Axis:   100  Text Interpretation: Sinus rhythm Confirmed by Lowery Rue [phone]) on 10/21/2023 3:21:38 PM  I have reviewed the labs performed to date as well as medications administered while in observation.  Recent changes in the last 24 hours include continuing to board. CIWA 10 this AM. Responding well to Ativan. No evidence of complicated withdrawal at this time to require medical admission.   Plan  Current plan is for inpatient psych.    Roberts Ching, MD 10/22/23 1220

## 2023-10-23 ENCOUNTER — Other Ambulatory Visit (HOSPITAL_COMMUNITY)
Admission: EM | Admit: 2023-10-23 | Discharge: 2023-10-24 | Disposition: A | Discharge status: Home / Self Care | Attending: Nurse Practitioner | Admitting: Nurse Practitioner

## 2023-10-23 DIAGNOSIS — F1010 Alcohol abuse, uncomplicated: Secondary | ICD-10-CM | POA: Insufficient documentation

## 2023-10-23 DIAGNOSIS — F331 Major depressive disorder, recurrent, moderate: Secondary | ICD-10-CM

## 2023-10-23 DIAGNOSIS — Z79899 Other long term (current) drug therapy: Secondary | ICD-10-CM | POA: Insufficient documentation

## 2023-10-23 DIAGNOSIS — G4700 Insomnia, unspecified: Secondary | ICD-10-CM | POA: Insufficient documentation

## 2023-10-23 DIAGNOSIS — F1090 Alcohol use, unspecified, uncomplicated: Secondary | ICD-10-CM

## 2023-10-23 DIAGNOSIS — F411 Generalized anxiety disorder: Secondary | ICD-10-CM | POA: Insufficient documentation

## 2023-10-23 DIAGNOSIS — F329 Major depressive disorder, single episode, unspecified: Secondary | ICD-10-CM | POA: Insufficient documentation

## 2023-10-23 DIAGNOSIS — I10 Essential (primary) hypertension: Secondary | ICD-10-CM | POA: Insufficient documentation

## 2023-10-23 LAB — CBC WITH DIFFERENTIAL/PLATELET
Abs Immature Granulocytes: 0.01 10*3/uL (ref 0.00–0.07)
Basophils Absolute: 0 10*3/uL (ref 0.0–0.1)
Basophils Relative: 1 %
Eosinophils Absolute: 0.2 10*3/uL (ref 0.0–0.5)
Eosinophils Relative: 6 %
HCT: 44.7 % (ref 39.0–52.0)
Hemoglobin: 15.1 g/dL (ref 13.0–17.0)
Immature Granulocytes: 0 %
Lymphocytes Relative: 42 %
Lymphs Abs: 1.4 10*3/uL (ref 0.7–4.0)
MCH: 30.9 pg (ref 26.0–34.0)
MCHC: 33.8 g/dL (ref 30.0–36.0)
MCV: 91.6 fL (ref 80.0–100.0)
Monocytes Absolute: 0.3 10*3/uL (ref 0.1–1.0)
Monocytes Relative: 8 %
Neutro Abs: 1.5 10*3/uL — ABNORMAL LOW (ref 1.7–7.7)
Neutrophils Relative %: 43 %
Platelets: 113 10*3/uL — ABNORMAL LOW (ref 150–400)
RBC: 4.88 MIL/uL (ref 4.22–5.81)
RDW: 12.6 % (ref 11.5–15.5)
WBC: 3.4 10*3/uL — ABNORMAL LOW (ref 4.0–10.5)
nRBC: 0 % (ref 0.0–0.2)

## 2023-10-23 LAB — COMPREHENSIVE METABOLIC PANEL WITH GFR
ALT: 153 U/L — ABNORMAL HIGH (ref 0–44)
AST: 98 U/L — ABNORMAL HIGH (ref 15–41)
Albumin: 2.2 g/dL — ABNORMAL LOW (ref 3.5–5.0)
Alkaline Phosphatase: 55 U/L (ref 38–126)
Anion gap: 8 (ref 5–15)
BUN: 11 mg/dL (ref 6–20)
CO2: 22 mmol/L (ref 22–32)
Calcium: 7.5 mg/dL — ABNORMAL LOW (ref 8.9–10.3)
Chloride: 104 mmol/L (ref 98–111)
Creatinine, Ser: 0.72 mg/dL (ref 0.61–1.24)
GFR, Estimated: >60 mL/min (ref >60)
Glucose, Bld: 87 mg/dL (ref 70–99)
Potassium: 3.6 mmol/L (ref 3.5–5.1)
Sodium: 134 mmol/L — ABNORMAL LOW (ref 135–145)
Total Bilirubin: 2.5 mg/dL — ABNORMAL HIGH (ref 0.0–1.2)
Total Protein: 4.9 g/dL — ABNORMAL LOW (ref 6.5–8.1)

## 2023-10-23 MED ORDER — OLANZAPINE 10 MG IM SOLR: 5.0000 mg | Freq: Three times a day (TID) | INTRAMUSCULAR | Status: DC | PRN

## 2023-10-23 MED ORDER — QUETIAPINE FUMARATE ER 200 MG PO TB24
200.0000 mg | ORAL_TABLET | Freq: Every day | ORAL | Status: DC
  Administered 2023-10-23: 200 mg via ORAL
  Filled 2023-10-23: qty 1

## 2023-10-23 MED ORDER — ADULT MULTIVITAMIN W/MINERALS CH
1.0000 | ORAL_TABLET | Freq: Every day | ORAL | Status: DC
  Administered 2023-10-23 – 2023-10-24 (×2): 1 via ORAL
  Filled 2023-10-23 (×2): qty 1

## 2023-10-23 MED ORDER — ACETAMINOPHEN 325 MG PO TABS: 650.0000 mg | ORAL_TABLET | Freq: Four times a day (QID) | ORAL | Status: DC | PRN

## 2023-10-23 MED ORDER — OLANZAPINE 5 MG PO TBDP: 5.0000 mg | ORAL_TABLET | Freq: Three times a day (TID) | ORAL | Status: DC | PRN

## 2023-10-23 MED ORDER — LOPERAMIDE HCL 2 MG PO CAPS: 2.0000 mg | ORAL_CAPSULE | ORAL | Status: DC | PRN

## 2023-10-23 MED ORDER — ALUM & MAG HYDROXIDE-SIMETH 200-200-20 MG/5ML PO SUSP: 30.0000 mL | ORAL | Status: DC | PRN

## 2023-10-23 MED ORDER — MAGNESIUM HYDROXIDE 400 MG/5ML PO SUSP: 30.0000 mL | Freq: Every day | ORAL | Status: DC | PRN

## 2023-10-23 MED ORDER — THIAMINE MONONITRATE 100 MG PO TABS
100.0000 mg | ORAL_TABLET | Freq: Every day | ORAL | Status: DC
  Administered 2023-10-24: 100 mg via ORAL
  Filled 2023-10-23: qty 1

## 2023-10-23 MED ORDER — ONDANSETRON 4 MG PO TBDP: 4.0000 mg | ORAL_TABLET | Freq: Four times a day (QID) | ORAL | Status: DC | PRN

## 2023-10-23 MED ORDER — OLANZAPINE 10 MG IM SOLR: 10.0000 mg | Freq: Three times a day (TID) | INTRAMUSCULAR | Status: DC | PRN

## 2023-10-23 MED ORDER — THIAMINE HCL 100 MG/ML IJ SOLN: 100.0000 mg | Freq: Once | INTRAMUSCULAR | Status: DC

## 2023-10-23 MED ORDER — LORAZEPAM 1 MG PO TABS: 1.0000 mg | ORAL_TABLET | Freq: Four times a day (QID) | ORAL | Status: DC | PRN

## 2023-10-23 MED ORDER — HYDROXYZINE HCL 25 MG PO TABS
25.0000 mg | ORAL_TABLET | Freq: Four times a day (QID) | ORAL | Status: DC | PRN
  Administered 2023-10-23: 25 mg via ORAL
  Filled 2023-10-23: qty 1

## 2023-10-23 MED ORDER — QUETIAPINE FUMARATE 50 MG PO TABS: 50.0000 mg | ORAL_TABLET | Freq: Two times a day (BID) | ORAL | Status: DC | PRN

## 2023-10-23 NOTE — ED Provider Notes (Addendum)
 Emergency Medicine Observation Re-evaluation Note  Nathan Chang. is a 38 y.o. male, seen on rounds today.  Pt initially presented to the ED for complaints of Alcohol Problem Currently, the patient is sleeping.  Physical Exam  BP (!) 138/97 (BP Location: Right Arm)   Pulse 93   Temp 98.3 F (36.8 C) (Oral)   Resp 18   Ht 5' 10 (1.778 m)   Wt 127 kg   SpO2 95%   BMI 40.18 kg/m  Physical Exam General: No acute distress Cardiac: Well-perfused Lungs: No respiratory distress Psych: Sleeping comfortably  ED Course / MDM  EKG:EKG Interpretation Date/Time:  Saturday October 21 2023 13:14:25 EDT Ventricular Rate:  71 PR Interval:  166 QRS Duration:  110 QT Interval:  390 QTC Calculation: 424 R Axis:   100  Text Interpretation: Sinus rhythm Confirmed by Lowery Rue [phone]) on 10/21/2023 3:21:38 PM  I have reviewed the labs performed to date as well as medications administered while in observation.  Recent changes in the last 24 hours include the patient was evaluated yesterday and inpatient psychiatric treatment was recommended.  Plan  Current plan is for psychiatric placement.    Carin Charleston, MD 10/23/23 [phone]  The patient has been accepted at [facility], MD 10/23/23 1219

## 2023-10-23 NOTE — ED Notes (Signed)
 Pt sleeping in no acute distress. RR even and unlabored. Environment secured. Will continue to monitor for safety.

## 2023-10-23 NOTE — Progress Notes (Signed)
 Pt has been accepted to [facility] on 10/23/2023 . Bed assignment:155  Pt meets inpatient criteria per Lady Pier, NP  Attending Physician will be Dr. Docia Freeman  Report can be called to: [phone]  Pt can arrive after asap  Care Team Notified: [facility] Bevin Bucks, RN, Roise Cleaver, NP, Justina Oman, RN

## 2023-10-23 NOTE — Group Note (Signed)
 Group Topic: Positive Affirmations  Group Date: 10/23/2023 Start Time: 2030 End Time: 2045 Facilitators: Soila Dunnings  Department: [facility]  Number of Participants: 4  Group Focus: relaxation, reminiscence, and self-esteem Treatment Modality:  Leisure Development Interventions utilized were leisure development Purpose: express feelings and express irrational fears  Name: Nathan Chang. Date of Birth: Apr 03, 1986  MR: [id number]    Level of Participation: pt did not attend group  Quality of Participation: pt did not attend group  Interactions with others: positive and minimal  Mood/Affect: appropriate and positive Triggers (if applicable): n/a Cognition: coherent/clear Progress: Gaining insight Response: pt had positive interactions with others on the unit but declined attending group  Plan: patient will be encouraged to attend groups  Patients Problems:  Patient Active Problem List   Diagnosis Date Noted   Alcohol abuse 10/23/2023   Alcohol withdrawal (HCC) 10/22/2023   Generalized anxiety disorder 10/22/2023

## 2023-10-23 NOTE — ED Notes (Signed)
 Report called to Emmitt Harp at [facility]

## 2023-10-23 NOTE — ED Notes (Addendum)
 Pt is isolative to his bedroom only coming out to take his medication. Pt denies SI/HI/AVH, nausea, tremors, sweats/chills, and pain.  Pt is med compliant and is safe on the unit at this time.

## 2023-10-23 NOTE — ED Notes (Signed)
 Pt transferred from [facility] to [facility] requesting detox from ETOH. Denies SI/HI/AVH. Calm, cooperative throughout interview process. Skin assessment completed. Oriented to unit. Meal and drink offered. At currrent, pt continue to endorse SI/HI/AVH. Pt verbally contract for safety. Will monitor for safety.

## 2023-10-24 DIAGNOSIS — I10 Essential (primary) hypertension: Secondary | ICD-10-CM | POA: Diagnosis not present

## 2023-10-24 DIAGNOSIS — F329 Major depressive disorder, single episode, unspecified: Secondary | ICD-10-CM | POA: Diagnosis not present

## 2023-10-24 DIAGNOSIS — F331 Major depressive disorder, recurrent, moderate: Secondary | ICD-10-CM

## 2023-10-24 DIAGNOSIS — F1010 Alcohol abuse, uncomplicated: Secondary | ICD-10-CM | POA: Diagnosis not present

## 2023-10-24 DIAGNOSIS — G4700 Insomnia, unspecified: Secondary | ICD-10-CM | POA: Diagnosis not present

## 2023-10-24 DIAGNOSIS — F1090 Alcohol use, unspecified, uncomplicated: Secondary | ICD-10-CM

## 2023-10-24 MED ORDER — HYDROXYZINE HCL 25 MG PO TABS: 25.0000 mg | ORAL_TABLET | Freq: Four times a day (QID) | ORAL | 0 refills | Status: AC | PRN

## 2023-10-24 MED ORDER — QUETIAPINE FUMARATE ER 200 MG PO TB24: 200.0000 mg | ORAL_TABLET | Freq: Every day | ORAL | 0 refills | Status: AC

## 2023-10-24 NOTE — ED Provider Notes (Signed)
 [facility]BC/OBS ASAP Discharge Summary  Date and Time: 10/24/2023 11:33 AM  Name: Nathan Chang.  MRN:  [id number]   Discharge Diagnoses: MDD, GAD, AUD   Subjective: Nathan Chang feels real good this morning. He experienced noticeable anxiety relief from PRN hydroxyzine  yesterday afternoon. He took Seroquel XR at bedtime. He awoke this morning after restful sleep feeling quite well. He showered and ate breakfast. He has modest anxiety (eager to return home) but overall anxiety is greatly improved. He did not participate in group activities due to social anxiety/discomfort. Today is his birthday and he's eager to see his 8yo son. Aside from dry mouth, the medications yesterday (hydroxyzine  PRN/quetiapine ER at bedtime) were effective and well-tolerated. He inquires about continuing escitalopram and possible drug-drug interactions with his BP med (valsartan) . . . Which he has not received since admission. Nathan Chang is agreeable to continuing medications and establishing outpatient therapy. Medications may be managed by PCP Marshall Skeeter - [facility]).  HPI: 38 year old male with a history of alcohol use and dependence, major depressive disorder, generalized anxiety disorder, hypertension who was sent from the behavioral health center with concern for worsening depression, alcohol use and desire for rehab.   Wife and patient report over the last 2 weeks he has been drinking excessively.  He has a history of alcohol abuse in the past, but over the last 2 weeks he has really just been drinking alcohol, and not eating or drinking much of anything else.  He has not been getting out of bed.  He just reports feeling really sick overall.  He also was drinking alcohol this morning.  He has had a history of some withdrawal in the past, but does not have symptoms of withdrawal yet, with drinking this morning.  He has not any fevers.  He a cough, chest pain, shortness of breath.  He has nausea, generalized feeling  of being unwell, and feels like he is dehydrated.  He reports he has had some suicidal ideation, but at this moment does not have SI with the plan.  He has had suicidal thoughts over the last couple of weeks and noticed increasing depression.  He reports he is ready for help with his alcohol use.  Total Time spent with patient: 30 minutes  Hospital Course: 38 year old male with a history of alcohol use and dependence, major depressive disorder, generalized anxiety disorder, hypertension presented to [facility] 6/14 with spouse/family for AUD but subsequently sent to [facility] ED due to blood pressure was 84/66, oxygen level was down to 79%.  He was placed on nonrebreather.  On arrival to the emergency department, condition had normalized. Labs grossly wnl notable for BAL 235, elevated transaminases, and low lung volumes. He was provided with supportive care, fluid support, and scheduled lorazepam until discharge to [facility] 6/16.   Patient endorsed long-standing self-medication with alcohol to treat anxiety; I drink to ease the pain. Sleep notable for initial/middle/terminal insomnia and being nonrestorative. On a usual night he might wake up 0300 and have 3-4 beers to mitigate anticipated anxiety. He would go back to sleep and then spend much of the day in bed. Over the past few weeks he's been adding more potent EtOH source. He would wake in the early afternoon and start worrying about managing when wife returns from work and son from school so he would drink more and go back to sleep alone.   Medications have been ineffective including current escitalopram 20mg  and low dose buspirone  (which he  did not take). Past trial included diazepam which was ineffective compared to lorazepam from [facility] ED (decent).   Nathan Chang agreed to a trial of quetiapine ER at bedtime for anxiety, insomnia along with PRN quetiapine IR for diurnal anxiety.  He received notable anxiety improvement with PRN dose of hydroxyzine . Quetiapine  ER given at bedtime resulted in quality, restorative sleep and good anxiety relief.   On day of discharge, patient well mental, emotionally, and physically better, although he did note we failed to provide his valsartan for BP control. He expressed desire to discharge home today and f/u with outpt therapy with PCP prescribing medication.    Current Medications:  Current Facility-Administered Medications  Medication Dose Route Frequency Provider Last Rate Last Admin   acetaminophen (TYLENOL) tablet 650 mg  650 mg Oral Q6H PRN Roise Cleaver, NP       alum & mag hydroxide-simeth (MAALOX/MYLANTA) 200-200-20 MG/5ML suspension 30 mL  30 mL Oral Q4H PRN Roise Cleaver, NP       hydrOXYzine  (ATARAX ) tablet 25 mg  25 mg Oral Q6H PRN Roise Cleaver, NP   25 mg at 10/23/23 1603   loperamide (IMODIUM) capsule 2-4 mg  2-4 mg Oral PRN Roise Cleaver, NP       LORazepam (ATIVAN) tablet 1 mg  1 mg Oral Q6H PRN Roise Cleaver, NP       magnesium hydroxide (MILK OF MAGNESIA) suspension 30 mL  30 mL Oral Daily PRN Roise Cleaver, NP       multivitamin with minerals tablet 1 tablet  1 tablet Oral Daily Roise Cleaver, NP   1 tablet at 10/24/23 0944   OLANZapine (ZYPREXA) injection 10 mg  10 mg Intramuscular TID PRN Roise Cleaver, NP       OLANZapine (ZYPREXA) injection 5 mg  5 mg Intramuscular TID PRN Roise Cleaver, NP       OLANZapine zydis (ZYPREXA) disintegrating tablet 5 mg  5 mg Oral TID PRN Roise Cleaver, NP       ondansetron (ZOFRAN-ODT) disintegrating tablet 4 mg  4 mg Oral Q6H PRN Roise Cleaver, NP       QUEtiapine (SEROQUEL XR) 24 hr tablet 200 mg  200 mg Oral QHS Jaylyn Booher C, MD   200 mg at 10/23/23 2117   QUEtiapine (SEROQUEL) tablet 50 mg  50 mg Oral BID PRN Samel Bruna C, MD       thiamine (VITAMIN B1) injection 100 mg  100 mg Intramuscular Once Roise Cleaver, NP       thiamine (VITAMIN B1) tablet 100 mg  100 mg Oral Daily Roise Cleaver, NP   100 mg at  10/24/23 0944   Current Outpatient Medications  Medication Sig Dispense Refill   escitalopram (LEXAPRO) 20 MG tablet Take 20 mg by mouth at bedtime.     ibuprofen (ADVIL) 200 MG tablet Take 400 mg by mouth 2 (two) times daily as needed for moderate pain (pain score 4-6) or headache.     valsartan (DIOVAN) 160 MG tablet Take 160 mg by mouth at bedtime.      PTA Medications:  Facility Ordered Medications  Medication   [COMPLETED] LORazepam (ATIVAN) injection 0-4 mg   Or   [COMPLETED] LORazepam (ATIVAN) tablet 0-4 mg   acetaminophen (TYLENOL) tablet 650 mg   alum & mag hydroxide-simeth (MAALOX/MYLANTA) 200-200-20 MG/5ML suspension 30 mL   magnesium hydroxide (MILK OF MAGNESIA) suspension 30 mL   OLANZapine zydis (ZYPREXA) disintegrating tablet 5 mg   OLANZapine (ZYPREXA) injection 5 mg  OLANZapine (ZYPREXA) injection 10 mg   thiamine (VITAMIN B1) injection 100 mg   thiamine (VITAMIN B1) tablet 100 mg   multivitamin with minerals tablet 1 tablet   LORazepam (ATIVAN) tablet 1 mg   hydrOXYzine  (ATARAX ) tablet 25 mg   loperamide (IMODIUM) capsule 2-4 mg   ondansetron (ZOFRAN-ODT) disintegrating tablet 4 mg   QUEtiapine (SEROQUEL) tablet 50 mg   QUEtiapine (SEROQUEL XR) 24 hr tablet 200 mg   PTA Medications  Medication Sig   escitalopram (LEXAPRO) 20 MG tablet Take 20 mg by mouth at bedtime.   valsartan (DIOVAN) 160 MG tablet Take 160 mg by mouth at bedtime.   ibuprofen (ADVIL) 200 MG tablet Take 400 mg by mouth 2 (two) times daily as needed for moderate pain (pain score 4-6) or headache.       10/24/2023   11:31 AM 10/05/2017    4:14 PM  Depression screen PHQ 2/9  Decreased Interest 3 0  Down, Depressed, Hopeless 3 0  PHQ - 2 Score 6 0  Altered sleeping 2   Tired, decreased energy 2   Change in appetite 2   Feeling bad or failure about yourself  3   Trouble concentrating 2   Moving slowly or fidgety/restless 2   Suicidal thoughts 0   PHQ-9 Score 19   Difficult doing  work/chores Extremely dIfficult     Flowsheet Row ED from 10/23/2023 in [facility] ED from 10/21/2023 in [facility] ED from 05/18/2023 in [facility]  C-SSRS RISK CATEGORY No Risk No Risk Low Risk    Psychiatric Specialty Exam  Presentation  General Appearance:  Disheveled  Eye Contact: Good  Speech: Clear and Coherent  Speech Volume: Normal  Handedness: Right   Mood and Affect  Mood: Euthymic  Affect: Appropriate   Thought Process  Thought Processes: Coherent  Descriptions of Associations:Intact  Orientation:Full (Time, Place and Person)  Thought Content:Logical     Hallucinations:Hallucinations: None  Ideas of Reference:None  Suicidal Thoughts:Suicidal Thoughts: No  Homicidal Thoughts:Homicidal Thoughts: No  Sensorium  Memory: Immediate Fair; Recent Fair; Remote Fair  Judgment: Fair  Insight: Good  Executive Functions  Concentration: Fair  Attention Span: Fair  Recall: Fair  Fund of Knowledge: Fair  Language: Fair  Psychomotor Activity  Psychomotor Activity: Psychomotor Activity: Normal  Assets  Assets: Communication Skills; Desire for Improvement; Housing; Social Support; Transportation  Sleep  Sleep: Sleep: Good   No data recorded  Physical Exam  Physical Exam ROS Blood pressure (!) 136/90, pulse 92, temperature 98.1 F (36.7 C), temperature source Oral, resp. rate 18, SpO2 98%. There is no height or weight on file to calculate BMI.  Demographic Factors:  Male  Loss Factors: NA  Historical Factors: NA  Risk Reduction Factors:   Positive social support  Continued Clinical Symptoms:  Depression:   Comorbid alcohol abuse/dependence  Cognitive Features That Contribute To Risk:  None    Suicide Risk:  Minimal: No identifiable suicidal ideation.  Patients presenting with no risk factors but with morbid  ruminations; may be classified as minimal risk based on the severity of the depressive symptoms  Plan Of Care/Follow-up recommendations: Continue regimen of quetiapine ER 200mg  at bedtime, escitalopram 20mg  qAM, and hydroxyzine  25mg  TID prn. Patient advised of treatment-emergent anticholingeric effects. Will provide options for outpt therapy including IOP and [facility]. Anticipate PCP to continue prescribing psychiatric regimen.    Disposition: Discharge home.  Nicklas Barns, MD  10/24/2023, 11:33 AM

## 2023-10-24 NOTE — Discharge Planning (Signed)
 Per MD, patient is stable for discharge home today with spouse. He was undecided on SAIOP services for substance abuse verses psychiatry and counseling. He was recommended for [facility] in [place] being uninsured and instructions were provided in his AVS for the [facility] and walk in clinic in his local area. He will be sent with 30 day scripts and is recommended to follow up with outpatient aftercare in 5-7 days. No other concerns noted or reported.

## 2023-10-24 NOTE — BH Assessment (Signed)
 LCSW met with patient to assess current mood, affect, physical state, and inquire about needs/goals while here in [facility] and after discharge. Patient reports his drinking had gotten worse over the past 2-4 weeks consuming one case of beer and 1/5 of liquor. He also stated that he was staying in his bed for past 2 weeks. He lives with wife who is supportive and brought him to [facility]. He stated they have one child an 40 year old son whom the grandparents have been taking care of during the past couple of weeks. Patient does drive and stated he would be able to follow up with outpatient care which is his goal for seeking either SAIOP or psychiatry and counseling. SW referred him to [facility] in [place] to the walk in clinic and is acceptable for uninsured patients.Patient currently denies any SI/HI/AVH and is alert and oriented x 4 with calm mood and cooperative. Patient expressed appreciation of LCSW assistance. No other needs were reported at this time by patient.

## 2023-10-24 NOTE — ED Notes (Signed)
 Pt presented lying in bed.  Calm and pleasant bright affect. Pt reported medication he was started on (seroquel) helped tremendously.  He reports he feels much better and hopeful for discharge. Denied withdrawal symptoms.  Denied SI HI and A/V hallucinations Q 15 minute observations continue for safety

## 2023-10-24 NOTE — Group Note (Signed)
 Group Topic: Communication  Group Date: 10/24/2023 Start Time: 0800 End Time: 0830 Facilitators: Chipper Council, NT  Department: [facility]  Number of Participants: 8  Group Focus: check in and community group Treatment Modality:  Psychoeducation Interventions utilized were group exercise and orientation Purpose: express feelings, express irrational fears, improve communication skills, and reinforce self-care  Name: Ala Capri. Date of Birth: 05/19/1985  MR: [id number]    Level of Participation: Pt did not attend group  Patients Problems:  Patient Active Problem List   Diagnosis Date Noted   Alcohol abuse 10/23/2023   Alcohol withdrawal (HCC) 10/22/2023   Generalized anxiety disorder 10/22/2023

## 2023-10-24 NOTE — ED Notes (Signed)
 Patient A&O x 4, ambulatory. Patient discharged in no acute distress. Patient denied SI/HI, A/VH upon discharge. Patient verbalized understanding of all discharge instructions reviewed on AVS via staff, to include follow up appointment recommendations and safety. Suicide safety plan completed and reviewed with Clinical research associate. A copy given to pt. Patient reported mood 10/10.  Patient escorted to lobby via staff for transport to destination. Safety maintained.

## 2023-10-24 NOTE — Group Note (Signed)
 Group Topic: Communication  Group Date: 10/24/2023 Start Time: 0930 End Time: 1300 Facilitators: Walterine Gunther, RN  Department: [facility]  Number of Participants: 4  Group Focus: nursing group Treatment Modality:  Psychoeducation Interventions utilized were patient education Purpose: increase insight  Name: Nathan Chang. Date of Birth: 06-28-1985  MR: [id number]    Level of Participation: moderate Quality of Participation: attentive Interactions with others: gave feedback Mood/Affect: appropriate Triggers (if applicable):  Cognition: goal directed Progress: Gaining insight Response:  Plan: patient will be encouraged to continue to take medications after discharge Patients Problems:  Patient Active Problem List   Diagnosis Date Noted   Alcohol abuse 10/23/2023   Alcohol withdrawal (HCC) 10/22/2023   Generalized anxiety disorder 10/22/2023

## 2023-10-24 NOTE — ED Notes (Signed)
 Pt was provided lunch

## 2023-10-24 NOTE — Discharge Instructions (Addendum)
 Based on the information that you have provided and the presenting issues outpatient services and resources for have been recommended.  It is imperative that you follow through with treatment recommendations within 5-7 days from the of discharge to mitigate further risk to your safety and mental well-being. A list of referrals has been provided below to get you started.  You are not limited to the list provided.  In case of an urgent crisis, you may contact the Mobile Crisis Unit with [facility] at [phone].   [facility] [address] [place], [place] [address] Outpatient Hours: Mon-Fri 8AM to 5PM  Phone: [phone] Fax: [phone]  [facility]) Hours: 24/7  Phone: [phone] Fax: [phone]  Services Adult Services Advanced Access Walk-In Assessments - All Disabilities Routine Outpatient Therapy Psychiatry/Med Management Substance Abuse Intensive Outpatient (SAIOP) Medication Assisted Treatment - MAT (Suboxone) Tailored Care Management (TCM)

## 2023-10-24 NOTE — ED Notes (Signed)
 Patient resting quietly in bed with eyes closed with unlabored breathing. Q 15 minute safety checks remain in place.  Pt remains safe on the unit at this time.
